# Patient Record
Sex: Female | Born: 1970 | Race: Black or African American | Hispanic: No | Marital: Single
Health system: Southern US, Community
[De-identification: ages and names within clinical notes are randomized; demographics above are authoritative.]

---

## 1998-11-21 ENCOUNTER — Emergency Department (HOSPITAL_COMMUNITY): Admission: EM | Admit: 1998-11-21 | Discharge: 1998-11-21 | Payer: Self-pay

## 1999-02-08 ENCOUNTER — Emergency Department (HOSPITAL_COMMUNITY): Admission: EM | Admit: 1999-02-08 | Discharge: 1999-02-08 | Payer: Self-pay | Admitting: Emergency Medicine

## 2000-11-07 ENCOUNTER — Emergency Department (HOSPITAL_COMMUNITY): Admission: EM | Admit: 2000-11-07 | Discharge: 2000-11-07 | Payer: Self-pay | Admitting: Emergency Medicine

## 2001-11-07 ENCOUNTER — Emergency Department (HOSPITAL_COMMUNITY): Admission: EM | Admit: 2001-11-07 | Discharge: 2001-11-07 | Payer: Self-pay | Admitting: Emergency Medicine

## 2001-11-09 ENCOUNTER — Encounter: Payer: Self-pay | Admitting: General Surgery

## 2001-11-09 ENCOUNTER — Encounter: Admission: RE | Admit: 2001-11-09 | Discharge: 2001-11-09 | Payer: Self-pay | Admitting: General Surgery

## 2001-12-03 ENCOUNTER — Encounter: Payer: Self-pay | Admitting: General Surgery

## 2001-12-03 ENCOUNTER — Encounter: Admission: RE | Admit: 2001-12-03 | Discharge: 2001-12-03 | Payer: Self-pay | Admitting: General Surgery

## 2001-12-28 ENCOUNTER — Emergency Department (HOSPITAL_COMMUNITY): Admission: EM | Admit: 2001-12-28 | Discharge: 2001-12-28 | Payer: Self-pay | Admitting: Emergency Medicine

## 2002-01-07 ENCOUNTER — Encounter: Admission: RE | Admit: 2002-01-07 | Discharge: 2002-01-07 | Payer: Self-pay | Admitting: General Surgery

## 2002-01-07 ENCOUNTER — Encounter: Payer: Self-pay | Admitting: General Surgery

## 2002-01-11 ENCOUNTER — Encounter: Admission: RE | Admit: 2002-01-11 | Discharge: 2002-01-11 | Payer: Self-pay | Admitting: General Surgery

## 2002-01-11 ENCOUNTER — Encounter: Payer: Self-pay | Admitting: General Surgery

## 2002-01-12 ENCOUNTER — Encounter (INDEPENDENT_AMBULATORY_CARE_PROVIDER_SITE_OTHER): Payer: Self-pay | Admitting: Specialist

## 2002-01-12 ENCOUNTER — Ambulatory Visit (HOSPITAL_BASED_OUTPATIENT_CLINIC_OR_DEPARTMENT_OTHER): Admission: RE | Admit: 2002-01-12 | Discharge: 2002-01-12 | Payer: Self-pay | Admitting: General Surgery

## 2003-04-08 ENCOUNTER — Other Ambulatory Visit: Admission: RE | Admit: 2003-04-08 | Discharge: 2003-04-08 | Payer: Self-pay | Admitting: Obstetrics and Gynecology

## 2004-12-27 ENCOUNTER — Ambulatory Visit (HOSPITAL_COMMUNITY): Admission: RE | Admit: 2004-12-27 | Discharge: 2004-12-27 | Payer: Self-pay | Admitting: Obstetrics and Gynecology

## 2005-01-14 ENCOUNTER — Emergency Department (HOSPITAL_COMMUNITY): Admission: EM | Admit: 2005-01-14 | Discharge: 2005-01-14 | Payer: Self-pay | Admitting: Emergency Medicine

## 2006-02-07 ENCOUNTER — Emergency Department (HOSPITAL_COMMUNITY): Admission: EM | Admit: 2006-02-07 | Discharge: 2006-02-07 | Payer: Self-pay | Admitting: Emergency Medicine

## 2008-03-05 ENCOUNTER — Emergency Department (HOSPITAL_COMMUNITY): Admission: EM | Admit: 2008-03-05 | Discharge: 2008-03-05 | Payer: Self-pay | Admitting: Emergency Medicine

## 2009-02-18 IMAGING — CT CT ANGIO CHEST
2 of 6 series · 19 of 36 positions shown · IV contrast (APPLIED)
Comparison: Chest radiography same day

CLINICAL DATA: Short of breath.  Cough.  Chest tightness.
Wheezing.

CT ANGIOGRAPHY CHEST
TECHNIQUE: Multidetector CT imaging of the chest using the
standard protocol during bolus administration of intravenous
contrast. Multiplanar reconstructed images obtained and reviewed to
evaluate the vascular anatomy.
Contrast: 80 ml 0mnipaque-EGG

[Series 8: thins for terarecon · axial · 0.63mm/px · z∈[+1027,+1276]mm · 18 of 277 slices shown]
[im 14/277  lung]
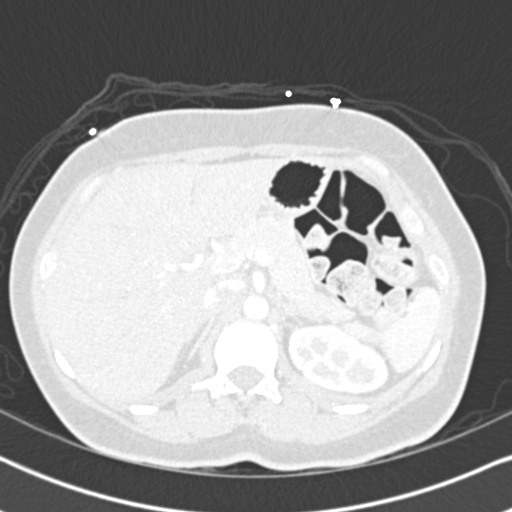
[im 28/277  mediastinal]
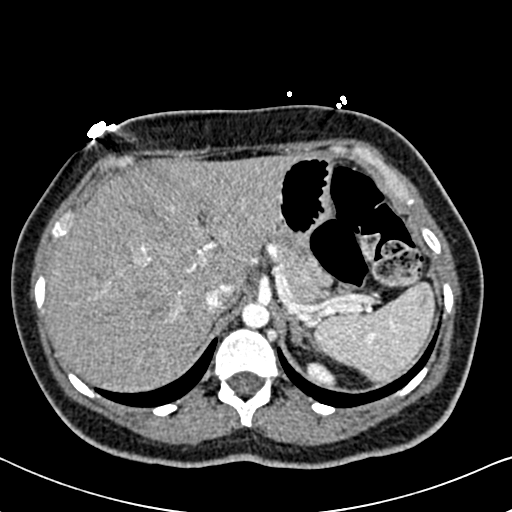
[im 42/277  lung]
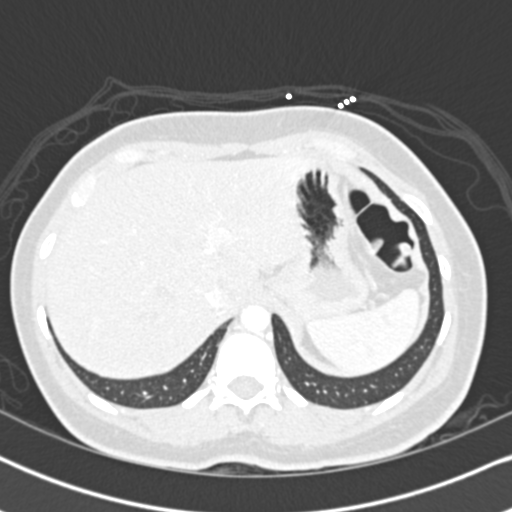
[im 56/277  mediastinal]
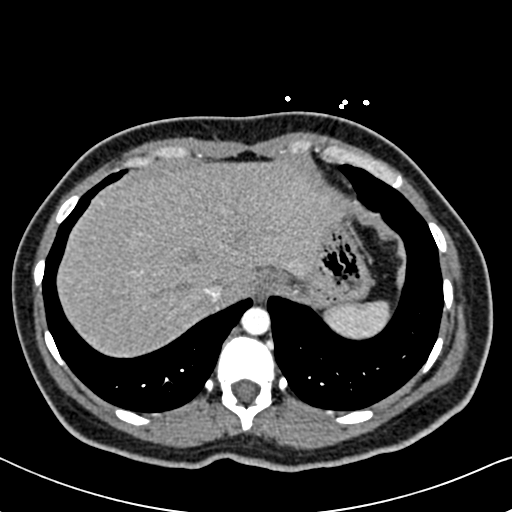
[im 70/277  lung]
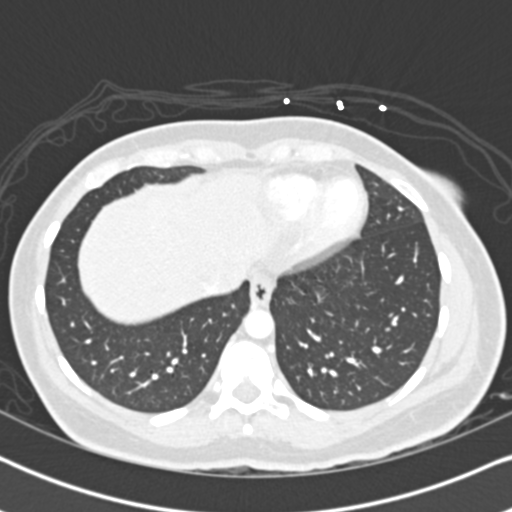
[im 83/277  mediastinal]
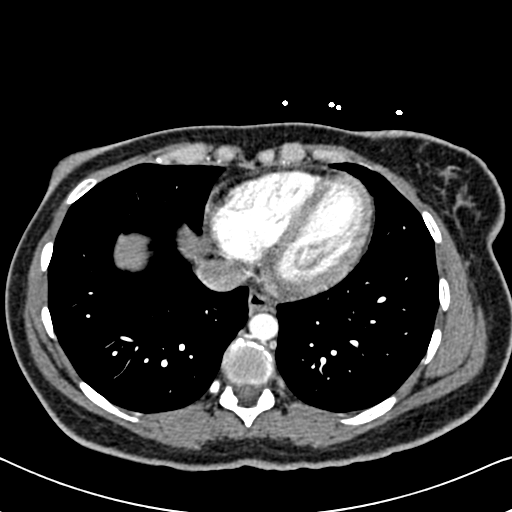
[im 97/277  lung]
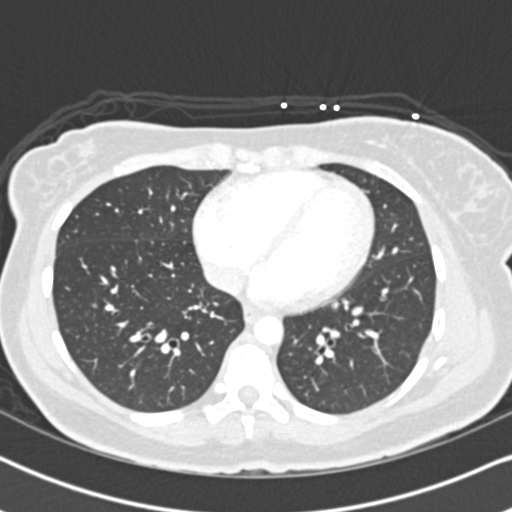
[im 111/277  mediastinal]
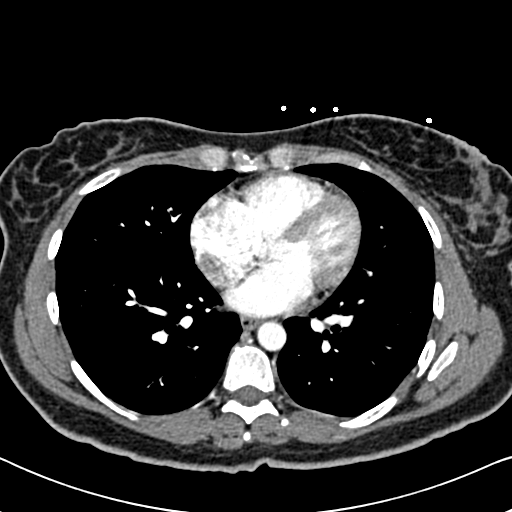
[im 125/277  lung]
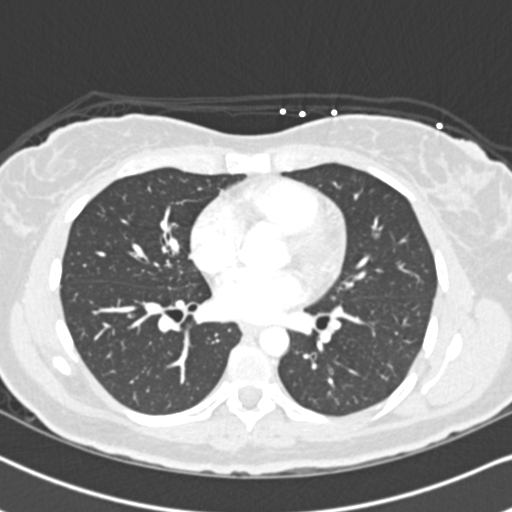
[im 152/277  mediastinal]
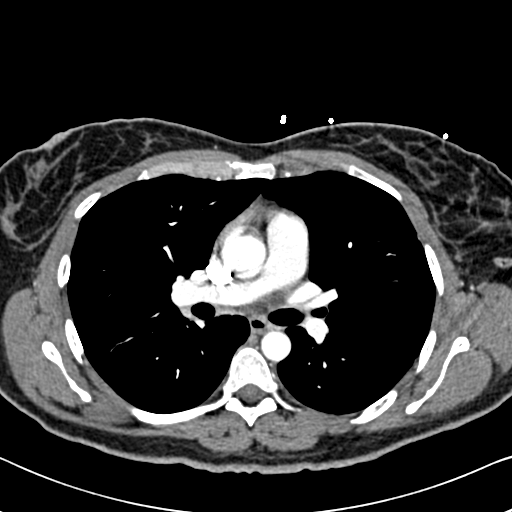
[im 166/277  lung]
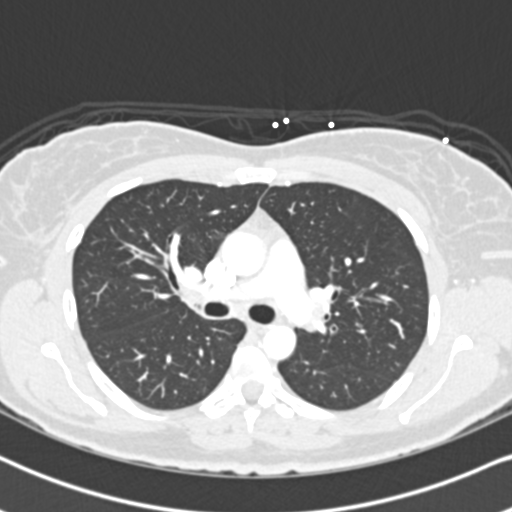
[im 180/277  mediastinal]
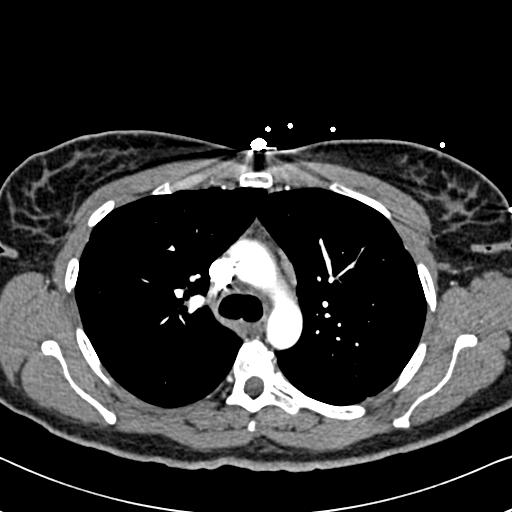
[im 194/277  lung]
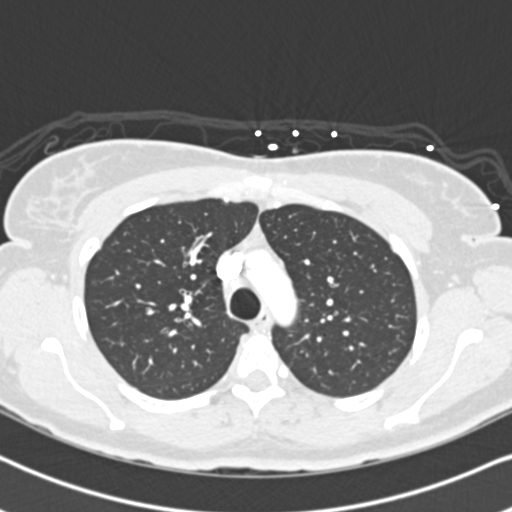
[im 208/277  mediastinal]
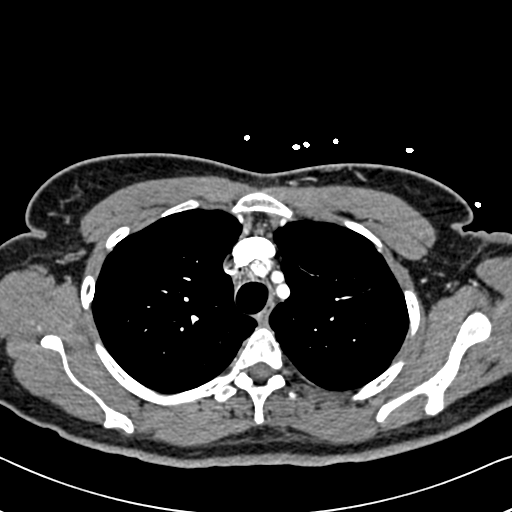
[im 221/277  lung]
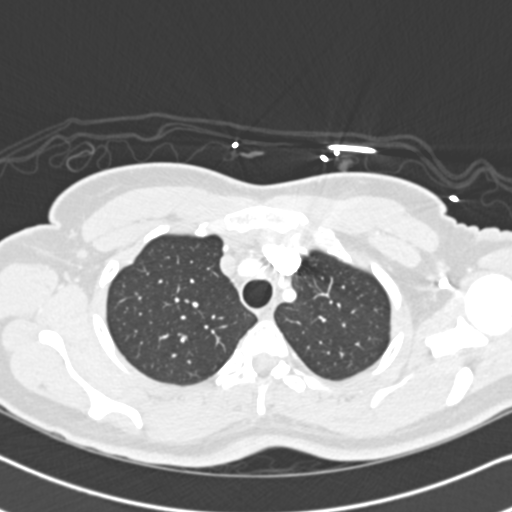
[im 235/277  mediastinal]
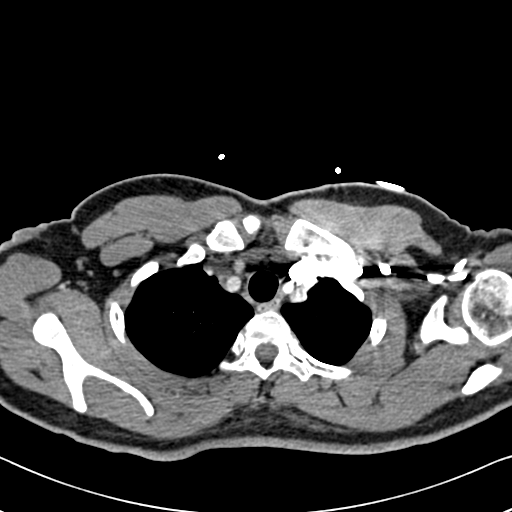
[im 249/277  lung]
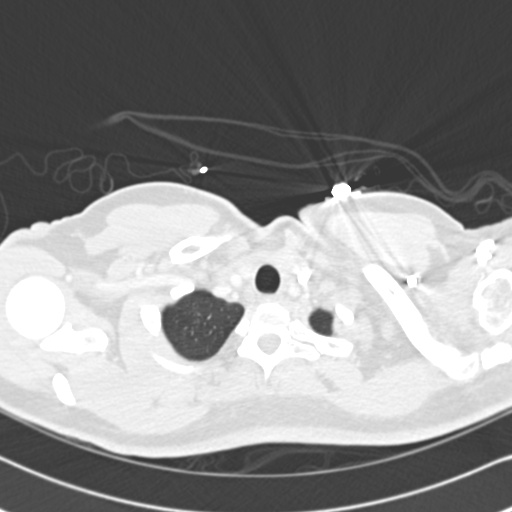
[im 263/277  mediastinal]
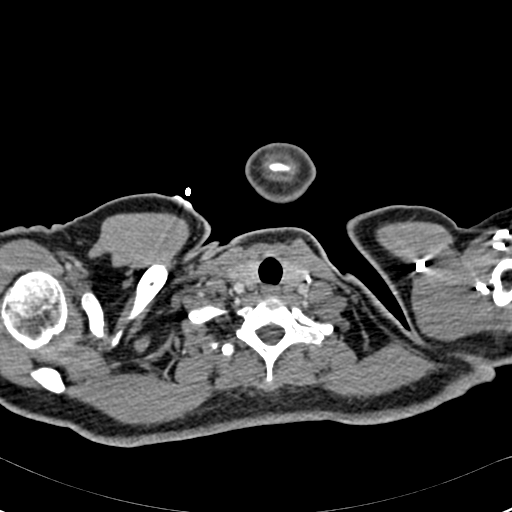

[Series 602: coronal mpr · coronal · 0.63mm/px · 1 of 86 slices shown]
[im 43/86  mediastinal]
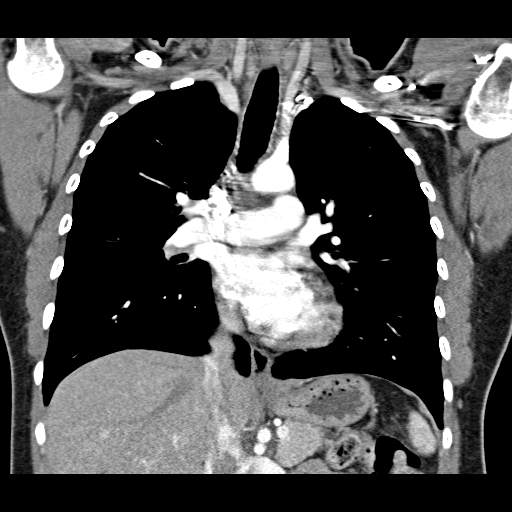

[19 of 36 positions shown; findings below may reference images not displayed]

FINDINGS: The pulmonary arterial tree is negative for evidence of
emboli.  No aortic pathology.  There is an incidental ductus bump.
The lungs are clear.  No pleural or pericardial fluid.  The upper
abdominal structures appear unremarkable.

No evidence of mediastinal or hilar mass or adenopathy.
IMPRESSION: Negative examination

## 2010-11-16 NOTE — Consult Note (Signed)
[facility]  Patient:    Barbara Rose, Barbara Rose Visit Number: [id number] MRN: [id number]          Service Type: EMS Location: ED Attending Physician:  Tobey Bride Dictated by:   Angelia Mould. Derrell Lolling, M.D. Proc. Date: 11/07/01 Admit Date:  11/07/2001 Discharge Date: 11/07/2001                            Consultation Report  CHIEF COMPLAINT:  Right breast tenderness and mass.  HISTORY OF PRESENT ILLNESS:  This is a 40 year old black female who presents with a tender mass in her right breast.  She was evaluated by Dr. Earlyne Iba and I was then asked to see her by him.  Patient gives a three day history of painful area in her right breast laterally.  She states she had a little bit of clear nipple discharge three days ago but that did not last.  She has had no more discharge.  Denies fever or chills.  Denies trauma.  Denies any breast problems in the past.  Denies any similar problems in the past.  PAST MEDICAL HISTORY:  She has had one pregnancy and one delivery.  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  She did not receive any surgery.  She was evaluated in [facility] and at [facility].  CURRENT MEDICATIONS:  None.  She used to take hormones or birth control pills, but discontinued those one year ago.  ALLERGIES:  None known.  FAMILY HISTORY:  Mother living, 31 years of age, has hypertension.  Father living, age 25, living and well.  Three brothers and two sisters living and well.  She has a great aunt that had breast cancer.  No other relatives with breast cancer.  PHYSICAL EXAMINATION  GENERAL:  Pleasant young black female somewhat overweight in no distress.  VITAL SIGNS:  Temperature 99.3, heart rate 84, blood pressure 126/76, respirations 20.  HEENT:  Sclerae:  Clear.  Extraocular movements are intact.  Oropharynx clear.  NECK:  Supple.  No adenopathy.  LUNGS:  Clear to  auscultation.  HEART:  Regular rate and rhythm.  No murmur.  BREASTS:  Moderately large.  In the right breast laterally there is about a 6-7 cm area of thickening.  It is not very discreet, but clearly thickened. This is a little bit tender.  The overlying skin may be slightly edematous, although not dramatically so.  The right nipple appears inverted.  There is no other mass in the right breast.  There is no axillary adenopathy on either side.  Cannot elicit a nipple discharge.  There is no mass in the left breast.  ABDOMEN:  Slightly obese, soft, nontender.  IMPRESSION:  Tender right breast mass.  This may be a bacterial mastitis or possibly could be an atypical breast cancer.  PLAN: 1. I advised the patient of the differential diagnoses of infection versus    cancer and urged her to follow up with me in the office. 2. Prescription for Duricef 500 mg b.i.d. x7 days given. 3. We have already scheduled her through the radiology department for    bilateral mammograms and right breast ultrasound at [facility] this coming Monday.  I have asked her to follow up with me in the office in three to four days. She has been given my card and urged to see me.  Dictated by:   Angelia Mould. Derrell Lolling, M.D. Attending Physician:  Tobey Bride DD:  11/07/01 TD:  11/09/01 Job: [id number]

## 2010-11-16 NOTE — Op Note (Signed)
[name]  Patient:    Barbara Rose, Barbara Rose Visit Number: [id number] MRN: [id number]          Service Type: DSU Location: [facility] Attending Physician:  Barbara Rose Dictated by:   Angelia Mould. Derrell Rose, M.D. Proc. Date: 01/12/02 Admit Date:  01/12/2002 Discharge Date: 01/12/2002                             Operative Report  PREOPERATIVE DIAGNOSIS: 1. Right breast mass. 2. Multiple right breast abscesses.  POSTOPERATIVE DIAGNOSIS: 1. Right breast mass. 2. Multiple right breast abscesses.  OPERATION PERFORMED: 1. Right breast biopsy. 2. Drainage of multiple right breast abscesses.  SURGEON:  Angelia Mould. Derrell Rose, M.D.  ANESTHESIA:  INDICATIONS FOR PROCEDURE:  The patient is a 40 year old black female who I initially saw in the [facility] emergency room on Nov 07, 2001.  At that time she had had a three-day history of area on the lateral aspect of the right breast with some nipple inversion, a tender area at the 9 oclock position.  There was a little bit of edema of the breast skin and I thought this was a mastitis.  She was placed on antibiotics and scheduled to come back and see me.  She had mammograms and right breast ultrasound which showed increased density in the right breast, disrupted architecture consistent with an inflammatory process.  She was continued on antibiotics, she failed office visit on June 10.  She failed to return phone calls.  She did return to see me on January 07, 2002 at which time she had had repeat ultrasound showing multiple fluid collections in the right breast laterally. She denied fever, chills or pain.  She was advised to undergo drainage of her fluid collections and biopsy to rule out cancer and she consented to this.  DESCRIPTION OF PROCEDURE:  Following the induction of general LMA anesthesia, the patients right breast was prepped and draped in sterile fashion.  0.5% Marcaine with  epinephrine was used as a local infiltration anesthetic.  I could palpate at least three areas arranged in a linear fashion at about the 9 oclock position.  A transverse incision was made at the 9 oclock position of the right breast.  Dissection was carried down into the subcutaneous tissue. Using a needle and syringe, I aspirated about 15 cc of light tan, creamy purulent material which was sent for aerobic and anaerobic cultures.  On exploration, I actually drained four separate fluid collections which looked like typical abscesses.  There was some thickened inflammatory tissue and I sent two samples of this from the abscess walls for histology to rule out carcinoma.  The wound was irrigated with saline.  Hemostasis was excellent and achieved with electrocautery.  I closed the skin with three sutures of 4-0 nylon at the ends and one in the center leaving two open areas for drainage. I placed Penrose drains, two of them in the wound down to the depths of the abscess cavities and sutured these to the skin with a nylon sutures.  A bulky bandage was placed.  The patient was taken to the recovery room in stable condition.  Estimated blood loss was about 25 cc.  Complications were none. Sponge, needle and instrument counts were correct.    DESCRIPTION OF PROCEDURE: Dictated by:   Angelia Mould. Derrell Rose, M.D. Attending Physician:  Barbara Rose DD:  01/12/02 TD:  01/14/02 Job: [id number]  [id number]

## 2011-11-18 ENCOUNTER — Other Ambulatory Visit: Payer: Self-pay | Admitting: Obstetrics and Gynecology

## 2011-11-18 DIAGNOSIS — Z1231 Encounter for screening mammogram for malignant neoplasm of breast: Secondary | ICD-10-CM

## 2011-12-04 ENCOUNTER — Ambulatory Visit: Payer: Self-pay

## 2014-02-21 ENCOUNTER — Encounter: Payer: Self-pay | Admitting: Internal Medicine

## 2014-04-19 ENCOUNTER — Ambulatory Visit: Payer: Self-pay | Admitting: Internal Medicine

## 2014-04-20 ENCOUNTER — Encounter: Payer: Self-pay | Admitting: Internal Medicine

## 2014-04-20 NOTE — Progress Notes (Signed)
The patient's chart has been reviewed by Dr. Lina Sarora Brodie  and the recommendations are noted below.  Follow-up necessary. Contact patient and schedule visit first available.  Outcome of communication with the patient:  No answer at number provided. Letter sent.   Per Dr Juanda ChanceBrodie, patient should be charged no show fee.
